# Patient Record
Sex: Male | Born: 1960 | Race: White | Hispanic: No | Marital: Married | Smoking: Never smoker
Health system: Southern US, Academic
[De-identification: ages and names within clinical notes are randomized; demographics above are authoritative.]

## PROBLEM LIST (undated history)

## (undated) DIAGNOSIS — I639 Cerebral infarction, unspecified: Secondary | ICD-10-CM

## (undated) DIAGNOSIS — I10 Essential (primary) hypertension: Secondary | ICD-10-CM

## (undated) DIAGNOSIS — E119 Type 2 diabetes mellitus without complications: Secondary | ICD-10-CM

## (undated) DIAGNOSIS — G4730 Sleep apnea, unspecified: Secondary | ICD-10-CM

## (undated) HISTORY — DX: Type 2 diabetes mellitus without complications (CMS HCC): E11.9

## (undated) HISTORY — PX: SHOULDER SURGERY: SHX246

## (undated) HISTORY — PX: HX ELBOW SURGERY: 2100001297

## (undated) HISTORY — DX: Sleep apnea, unspecified: G47.30

## (undated) HISTORY — PX: LEG SURGERY: SHX1003

## (undated) HISTORY — DX: Essential (primary) hypertension: I10

## (undated) HISTORY — DX: Cerebral infarction, unspecified (CMS HCC): I63.9

---

## 1997-04-12 ENCOUNTER — Other Ambulatory Visit (HOSPITAL_COMMUNITY): Payer: Self-pay | Admitting: INTERNAL MEDICINE

## 2020-01-08 IMAGING — CT CT ABDOMEN & PELVIS WITHOUT DYE
2 of 3 series · 17 of 46 positions shown, 19 images · non-contrast
Comparison: None.

﻿EXAM:  CT ABDOMEN & PELVIS WITHOUT DYE
INDICATION: 59-year-old with right upper quadrant pain.  No prior malignancy.
TECHNIQUE: CT was performed without IV contrast and after oral contrast and reviewed in multiple projections.  Exam was performed using one or more of the following dose reduction techniques: Automated exposure control, adjustment of the mA and/or kV according to patient size, or the use of iterative reconstruction technique. Radiation dose 9925 mGy cm.

[cor · coronal · 1.14mm/px · 3 of 163 slices shown]
[im 55/163  soft-tissue]
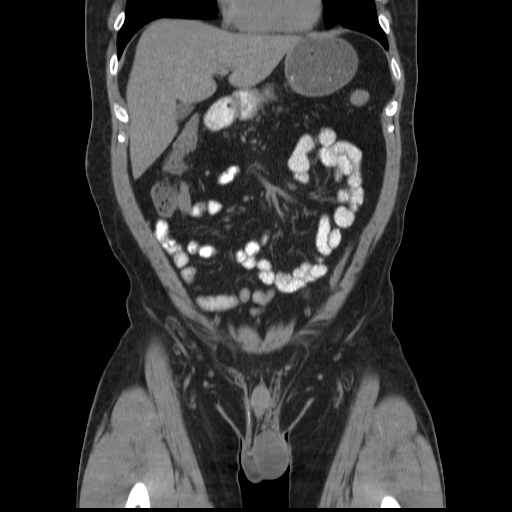
[im 73/163  soft-tissue]
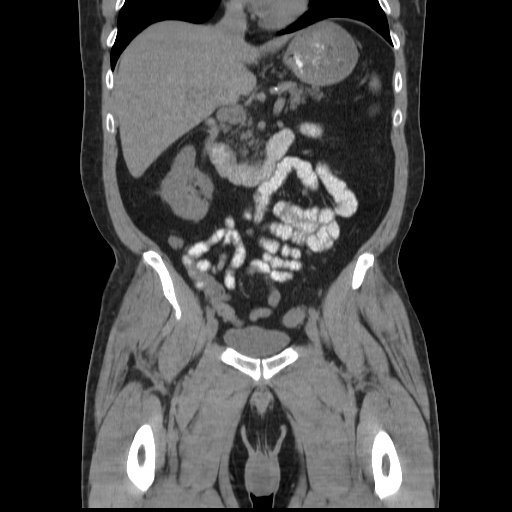
[im 91/163  soft-tissue]
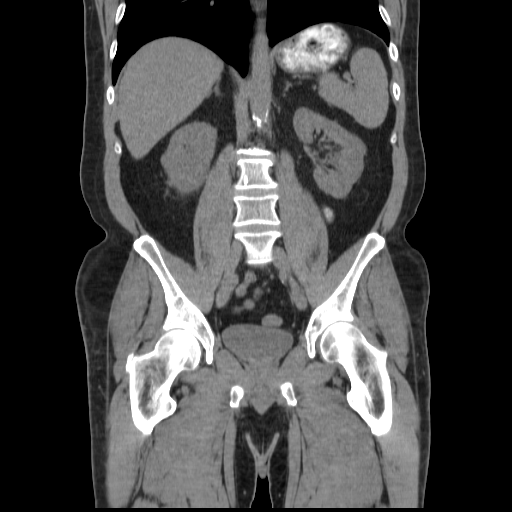

[ax · axial · 0.81mm/px · z∈[-1310,-796]mm · 14 of 297 slices shown, 16 images]
[im 20/297  soft-tissue]
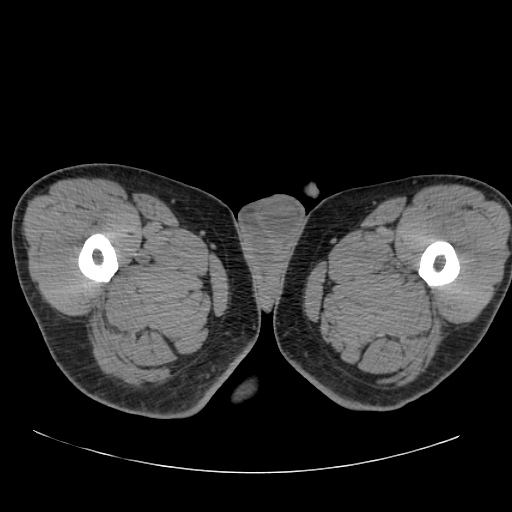
[im 20/297  bone]
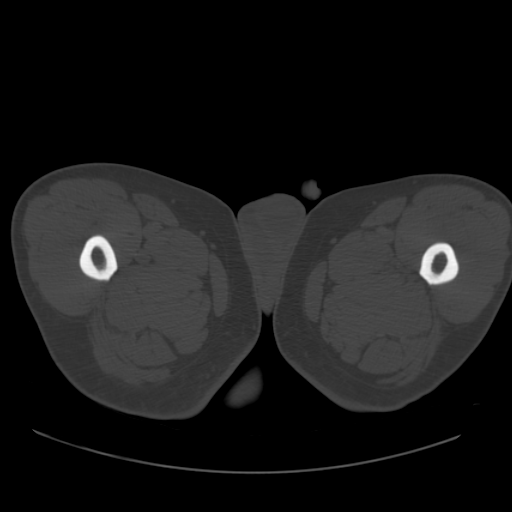
[im 39/297  soft-tissue]
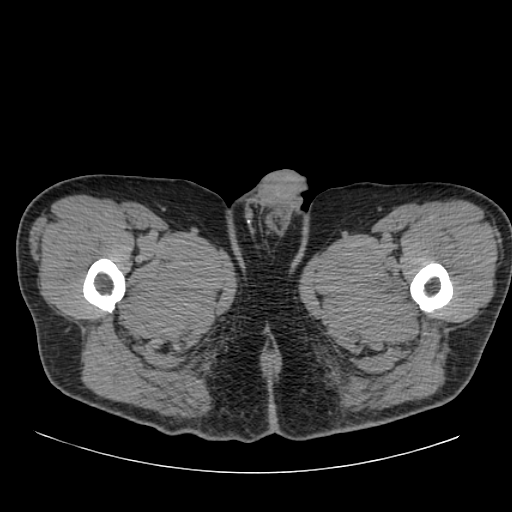
[im 58/297  soft-tissue]
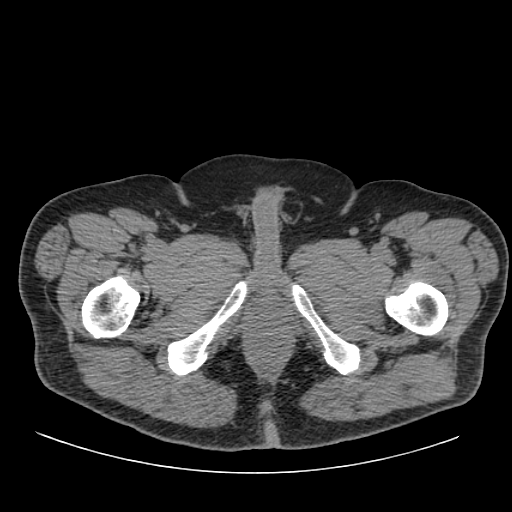
[im 77/297  soft-tissue]
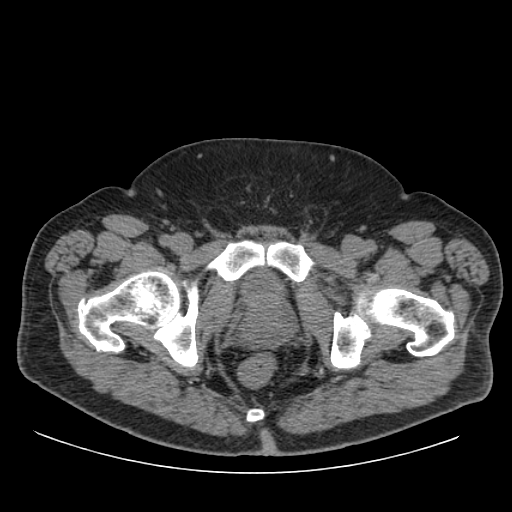
[im 96/297  soft-tissue]
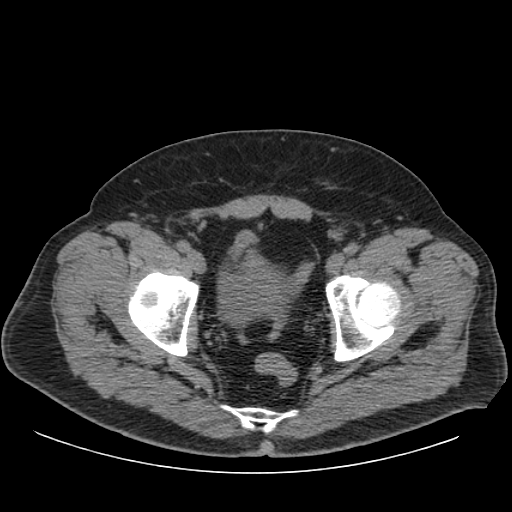
[im 115/297  soft-tissue]
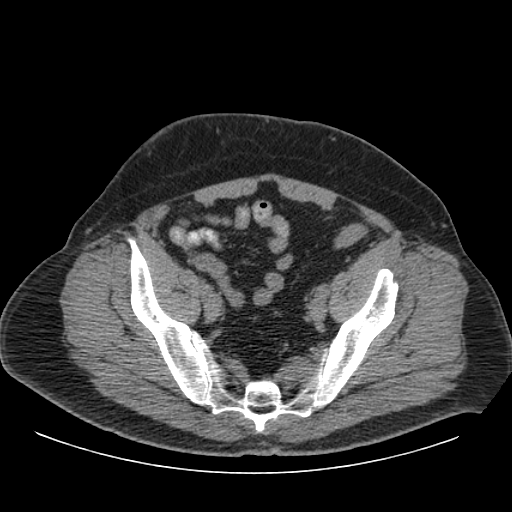
[im 134/297  soft-tissue]
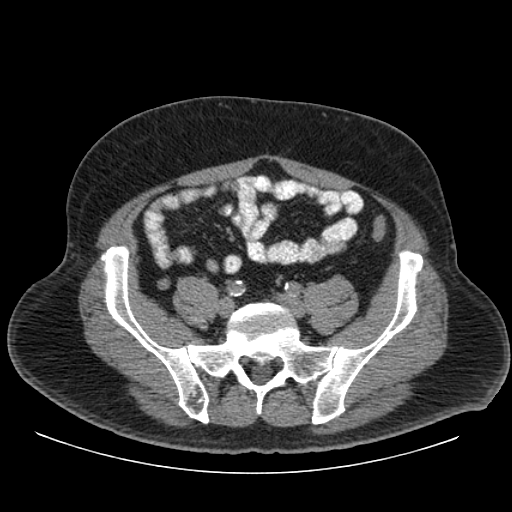
[im 163/297  soft-tissue]
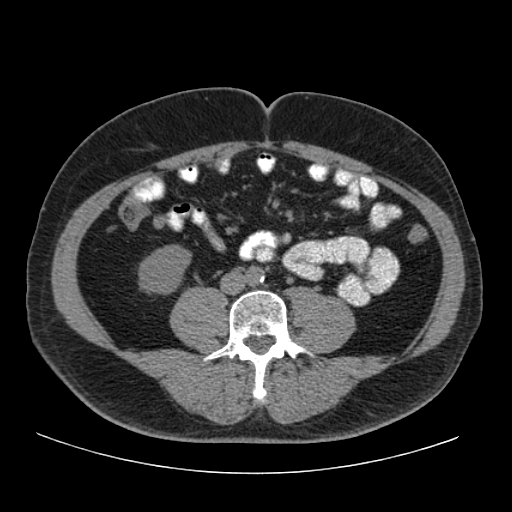
[im 182/297  soft-tissue]
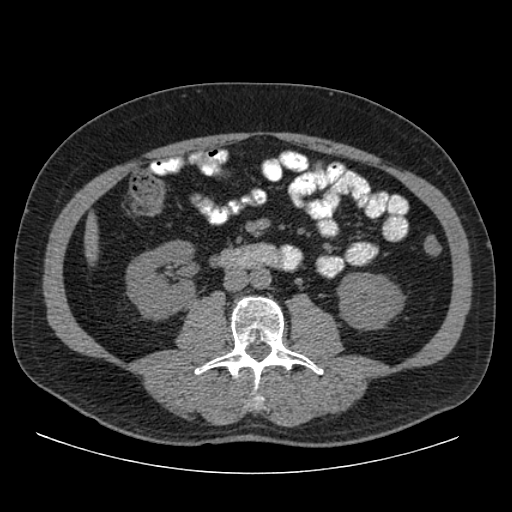
[im 182/297  bone]
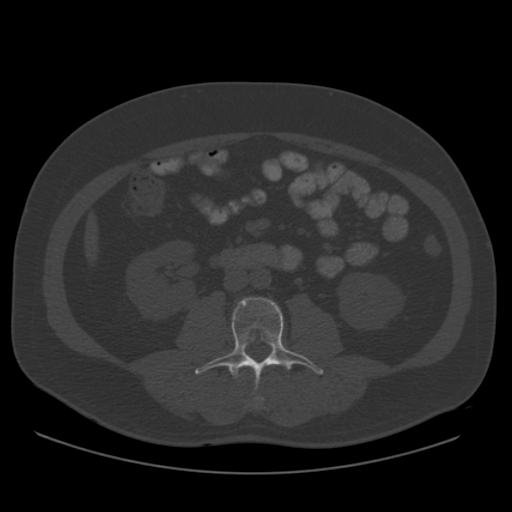
[im 201/297  soft-tissue]
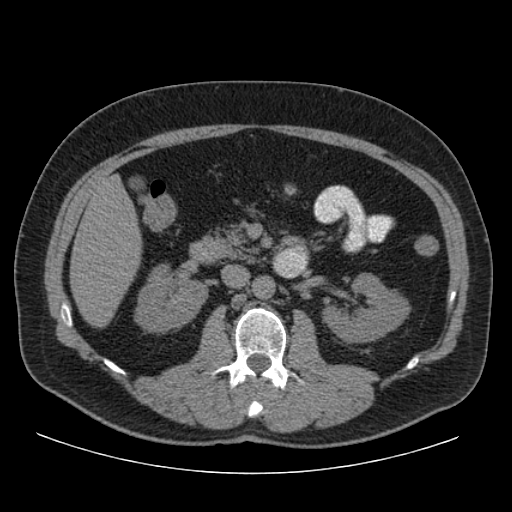
[im 220/297  soft-tissue]
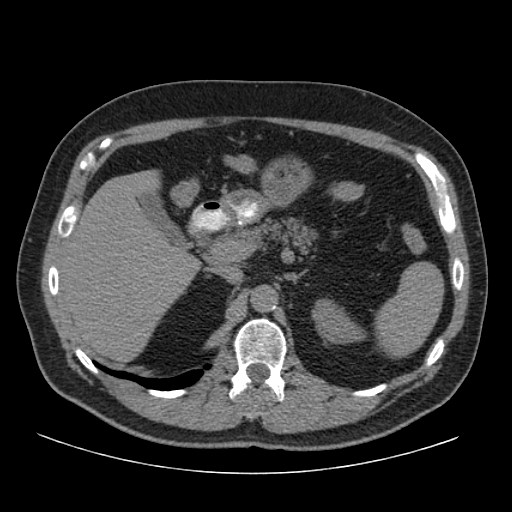
[im 239/297  soft-tissue]
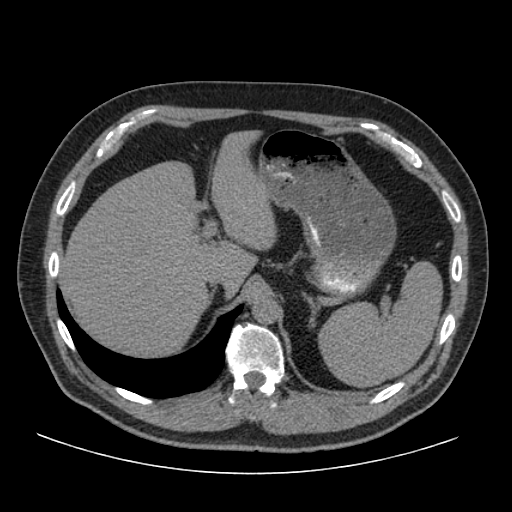
[im 258/297  soft-tissue]
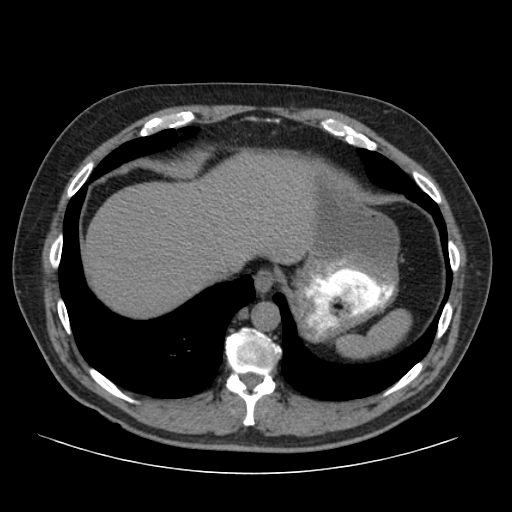
[im 277/297  soft-tissue]
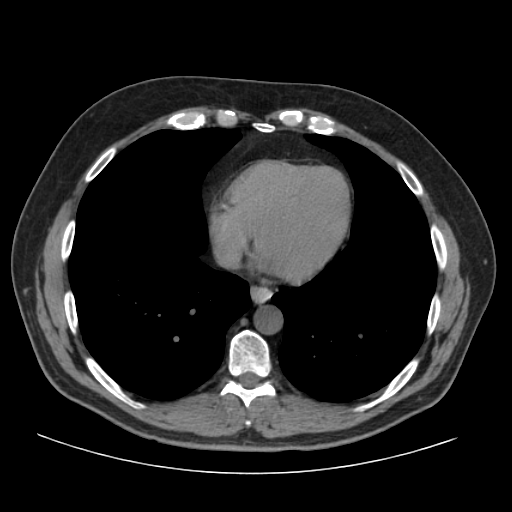

[17 of 46 positions shown; findings below may reference images not displayed]

FINDINGS: The lung bases do not show any acute or focal abnormalities.  Small hiatus hernia is noted in the lower mediastinum.  

Normal sized liver and spleen.  Gallbladder, pancreas do not show acute findings.  

No calculi or obstruction of the kidneys is seen.  Mild lobulation of the cortical margin of the right kidney is noted.  No retroperitoneal adenopathy or bowel obstruction is seen.  The appendix is normal in size.  No pelvic mass or fluid collections are seen.  Moderately enlarged prostate gland with asymmetry of peripheral zone is noted particularly on the right side measuring 5 cm in transverse diameter.
IMPRESSION: 1. Limited noncontrast examination is not optimal to evaluate solid viscera, neoplasms and vascular structures and GI tract.  

2. No acute findings are seen in the upper abdomen and pelvis.  

3. Other findings including small hiatus hernia and moderately enlarged prostate gland are noted.

## 2021-03-06 ENCOUNTER — Ambulatory Visit (INDEPENDENT_AMBULATORY_CARE_PROVIDER_SITE_OTHER): Payer: Self-pay | Admitting: NURSE PRACTITIONER

## 2021-08-19 ENCOUNTER — Ambulatory Visit (INDEPENDENT_AMBULATORY_CARE_PROVIDER_SITE_OTHER): Payer: Self-pay | Admitting: NURSE PRACTITIONER

## 2021-10-14 ENCOUNTER — Ambulatory Visit (INDEPENDENT_AMBULATORY_CARE_PROVIDER_SITE_OTHER): Payer: Commercial Managed Care - PPO | Admitting: NURSE PRACTITIONER

## 2021-10-30 ENCOUNTER — Other Ambulatory Visit: Payer: Self-pay

## 2021-10-30 ENCOUNTER — Encounter (INDEPENDENT_AMBULATORY_CARE_PROVIDER_SITE_OTHER): Payer: Self-pay | Admitting: NURSE PRACTITIONER

## 2021-10-30 ENCOUNTER — Ambulatory Visit (INDEPENDENT_AMBULATORY_CARE_PROVIDER_SITE_OTHER): Payer: Commercial Managed Care - PPO | Admitting: NURSE PRACTITIONER

## 2021-10-30 VITALS — Ht 69.0 in | Wt 199.0 lb

## 2021-10-30 DIAGNOSIS — R490 Dysphonia: Secondary | ICD-10-CM

## 2021-10-30 DIAGNOSIS — K219 Gastro-esophageal reflux disease without esophagitis: Secondary | ICD-10-CM

## 2021-10-30 DIAGNOSIS — R0982 Postnasal drip: Secondary | ICD-10-CM

## 2021-10-30 MED ORDER — FAMOTIDINE 40 MG TABLET
40.0000 mg | ORAL_TABLET | Freq: Every evening | ORAL | 3 refills | Status: DC
Start: 2021-10-30 — End: 2022-03-27

## 2021-10-30 NOTE — H&P (Signed)
[facility], [facility]  [address]  [place] [address]  Phone: [phone]  Fax: [phone]      Encounter Date: 10/30/2021    Patient ID: Brendan Guerrero  MRN: [id number]    DOB: 08-27-1960  Age: 61 y.o. male         Referring Provider:    Jerold Coombe, FNP  [address]  [place],  [place] [address]    Reason for Visit:   Chief Complaint   Patient presents with    Throat Symptoms     Pt complains of sore throat, PND, cough and hoarseness x 4 years        History of Present Illness:  Brendan Guerrero is a 61 y.o. male complains of throat discomfort and PND since 2018.  Started out with a coating in his mouth.  He was seen by dentist and suggested he has Hepatitis but no evaluation for this was ever done.  Will have hoarseness, a foul taste, and increased pnd in am.        Patient History:  There is no problem list on file for this patient.    Current Outpatient Medications   Medication Sig    atorvastatin (LIPITOR) 80 mg Oral Tablet     buprenorphine 10 mcg/hour Transdermal Patch Weekly     famotidine (PEPCID) 40 mg Oral Tablet Take 1 Tablet (40 mg total) by mouth Every evening    FARXIGA 10 mg Oral Tablet     fenofibrate nanocrystallized (TRICOR) 145 mg Oral Tablet     FLUoxetine (PROZAC) 20 mg Oral Capsule     gabapentin (NEURONTIN) 800 mg Oral Tablet     hydrOXYzine HCL (ATARAX) 25 mg Oral Tablet     lisinopriL (PRINIVIL) 10 mg Oral Tablet Take 1 Tablet (10 mg total) by mouth    MetFORMIN (GLUCOPHAGE) 1,000 mg Oral Tablet     MOUNJARO 5 mg/0.5 mL Subcutaneous Pen Injector     oxyCODONE-acetaminophen (PERCOCET) 10-325 mg Oral tablet      No Known Allergies  Past Medical History:   Diagnosis Date    CVA (cerebrovascular accident) (CMS HCC)     Diabetes mellitus (CMS HCC)     Essential hypertension     Sleep apnea       Past Surgical History:   Procedure Laterality Date    HX ELBOW SURGERY Bilateral     LEG SURGERY      SHOULDER SURGERY        Family Medical History:    None         Social History     Tobacco  Use    Smoking status: Never    Smokeless tobacco: Never   Substance Use Topics    Alcohol use: Never    Drug use: Never       Review of Systems     Vitals:    10/30/21 1403   Weight: 90.3 kg (199 lb)   Height: 1.753 m (5\' 9" )   BMI: 29.45      ENT Physical Exam  Constitutional  Appearance: patient appears well-developed, well-nourished and well-groomed,  Communication/Voice: communication appropriate for developmental age; vocal quality normal;  Head and Face  Appearance: head appears normal, face appears normal and face appears atraumatic;  Palpation: facial palpation normal;  Salivary: glands normal;  Ear  Hearing: intact;  Auricles: right auricle normal; left auricle normal;  External Mastoids: right external mastoid normal; left external mastoid normal;  Ear Canals: right ear canal  normal; left ear canal normal;  Tympanic Membranes: right tympanic membrane normal; left tympanic membrane normal;  Nose  External Nose: nares patent bilaterally; external nose normal;  Internal Nose: nasal mucosa normal; septum normal; bilateral inferior turbinates normal;  Oral Cavity/Oropharynx  Lips: normal;  Teeth: normal;  Gums: gingiva normal;  Tongue: normal;  Oral mucosa: normal;  Hard palate: normal;  Soft palate: normal;  Tonsils: normal;  Base of Tongue: normal;  Posterior pharyngeal wall: normal;  Neck  Neck: neck normal; neck palpation normal;  Thyroid: thyroid normal;  Respiratory  Inspection: breathing unlabored; normal breathing rate;  Lymphatic  Palpation: lymph nodes normal;  Neurovestibular  Mental Status: alert and oriented;  Psychiatric: mood normal; affect is appropriate;  Cranial Nerves: cranial nerves intact;     Assessment:  ENCOUNTER DIAGNOSES     ICD-10-CM   1. Post-nasal drainage  R09.82   2. Hoarseness  R49.0   3. 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  Oral exam normal, no coating on tongue  Throat symptoms  are due to LPR.  Advised reflux diet and  famotidine 40 mg nightly.  Will repeat laryngoscopy next visit.      Orders Placed This Encounter    31575 - LARYNGOSCOPY, FLEXIBLE DIAGNOSTIC (AMB ONLY)    famotidine (PEPCID) 40 mg Oral Tablet     Return for 6-8 weeks.    Elnora Morrison, FNP-BC  10/30/2021, 14:30

## 2021-11-06 NOTE — Procedures (Signed)
[facility]  [address]  [place] [address]    Procedure Note    Name: ZAYVIER CARAVELLO MRN:  [id number]   Date: 10/30/2021 Age: 61 y.o.  DOB:   10/30/1960       00923 - LARYNGOSCOPY, FLEXIBLE DIAGNOSTIC (AMB ONLY)  Performed by: Elnora Morrison, FNP-BC  Authorized by: Elnora Morrison, FNP-BC     Time Out:     Immediately before the procedure, a time out was called:  Yes    Patient verified:  Yes    Procedure Verified:  Yes    Site Verified:  Yes  Documentation:      Indications for procedure: Unable to perform indirect laryngoscopy secondary to gag reflex, Unable to fully visualize laryngeal anatomy on indirect laryngoscopy, Hoarseness, and Chronic postnasal drip    Anesthesia: Oxymetazoline nasal spray    Description: The flexible endoscope was gently introduced into the nostril and passed along the floor of the nose to the nasopharynx. Adenoid was minimal and eustachian tubes normal. The retropalatal airway was patent.    The endoscope was passed to the oropharynx. Base of tongue displayed normal lingual tonsils, patent valelulla, and sharply defined upright epiglottis. Retrolingual airway was patent.    The larynx displayed normal true vocal cords with good mobility. False cords were normal. Arytenoid mucosa was pink with no edema.     The piriform recesses were symmetric without secretion. The hypopharynx was symmetric without lesion.    Findings: Laryngopharyngeal Reflux    The patient tolerated the procedure well.               Elnora Morrison, FNP-BC

## 2021-11-13 ENCOUNTER — Ambulatory Visit (INDEPENDENT_AMBULATORY_CARE_PROVIDER_SITE_OTHER): Payer: Commercial Managed Care - PPO | Admitting: NURSE PRACTITIONER

## 2021-12-25 ENCOUNTER — Encounter (INDEPENDENT_AMBULATORY_CARE_PROVIDER_SITE_OTHER): Payer: Self-pay | Admitting: NURSE PRACTITIONER

## 2021-12-25 ENCOUNTER — Other Ambulatory Visit: Payer: Self-pay

## 2021-12-25 ENCOUNTER — Ambulatory Visit (INDEPENDENT_AMBULATORY_CARE_PROVIDER_SITE_OTHER): Payer: Commercial Managed Care - PPO | Admitting: NURSE PRACTITIONER

## 2021-12-25 VITALS — Ht 70.0 in | Wt 193.0 lb

## 2021-12-25 DIAGNOSIS — R490 Dysphonia: Secondary | ICD-10-CM

## 2021-12-25 DIAGNOSIS — K219 Gastro-esophageal reflux disease without esophagitis: Secondary | ICD-10-CM

## 2021-12-25 DIAGNOSIS — R1312 Dysphagia, oropharyngeal phase: Secondary | ICD-10-CM

## 2021-12-25 NOTE — Procedures (Signed)
[facility]  [place] [id number]    Procedure Note    Name: RODELL GANTZ MRN:  [id number]   Date: 12/25/2021 Age: 61 y.o.  DOB:   07-30-1960       [id number] - LARYNGOSCOPY, FLEXIBLE DIAGNOSTIC (AMB ONLY)    Performed by: Emiliano Dyer, FNP-BC  Authorized by: Emiliano Dyer, FNP-BC    Time Out:     Immediately before the procedure, a time out was called:  Yes    Patient verified:  Yes    Procedure Verified:  Yes    Site Verified:  Yes  Documentation:      Indications for procedure: Unable to perform indirect laryngoscopy secondary to gag reflex, Unable to fully visualize laryngeal anatomy on indirect laryngoscopy, and Dysphagia    Anesthesia: Oxymetazoline nasal spray    Description: The flexible endoscope was gently introduced into the nostril and passed along the floor of the nose to the nasopharynx. Adenoid was minimal and eustachian tubes normal. The retropalatal airway was patent.    The endoscope was passed to the oropharynx. Base of tongue displayed normal lingual tonsils, patent valelulla, and sharply defined upright epiglottis. Retrolingual airway was patent.    The larynx displayed normal true vocal cords with good mobility. False cords were normal. Arytenoid mucosa was pink with no edema.     The piriform recesses were symmetric without secretion. The hypopharynx was symmetric without lesion.    Findings: Laryngopharyngeal Reflux improved    The patient tolerated the procedure well.                 Emiliano Dyer, FNP-BC

## 2021-12-25 NOTE — Progress Notes (Signed)
[facility]  [address]  [place] [address]  Phone: [phone]  Fax: [phone]      Encounter Date: 12/25/2021    Patient ID: Brendan Guerrero  MRN: [id number]    DOB: Jun 24, 1960  Age: 61 y.o. male     Progress Note       Referring Provider:  No ref. provider found    Reason for Visit:   Chief Complaint   Patient presents with    Acid Reflux     8 week rc, patient complains of PND and globus sensation        History of Present Illness:  Brendan Guerrero is a 61 y.o. male follow up hoarseness and LPR.  He continues to have a globus sensation and some dysphagia with solids and liquids.        Patient History:  There is no problem list on file for this patient.    Current Outpatient Medications   Medication Sig    atorvastatin (LIPITOR) 80 mg Oral Tablet     famotidine (PEPCID) 40 mg Oral Tablet Take 1 Tablet (40 mg total) by mouth Every evening    FARXIGA 10 mg Oral Tablet     fenofibrate nanocrystallized (TRICOR) 145 mg Oral Tablet     FLUoxetine (PROZAC) 40 mg Oral Capsule     hydrOXYzine HCL (ATARAX) 25 mg Oral Tablet     ketorolac tromethamine (TORADOL) 10 mg Oral Tablet     lisinopriL (PRINIVIL) 10 mg Oral Tablet Take 1 Tablet (10 mg total) by mouth    MetFORMIN (GLUCOPHAGE) 1,000 mg Oral Tablet     MOUNJARO 5 mg/0.5 mL Subcutaneous Pen Injector       No Known Allergies  Past Medical History:   Diagnosis Date    CVA (cerebrovascular accident) (CMS HCC)     Diabetes mellitus (CMS HCC)     Essential hypertension     Sleep apnea      Past Surgical History:   Procedure Laterality Date    HX ELBOW SURGERY Bilateral     LEG SURGERY      SHOULDER SURGERY       Family Medical History:    None         Social History     Tobacco Use    Smoking status: Never    Smokeless tobacco: Never   Substance Use Topics    Alcohol use: Never    Drug use: Never       Review of Systems     Vitals:    12/25/21 0956   Weight: 87.5 kg (193 lb)   Height: 1.778 m (5\' 10" )   BMI: 27.75      ENT Physical  Exam  Constitutional  Appearance: patient appears well-developed, well-nourished and well-groomed,  Communication/Voice: communication appropriate for developmental age; vocal quality normal;  Head and Face  Appearance: head appears normal, face appears normal and face appears atraumatic;  Palpation: facial palpation normal;  Salivary: glands normal;  Ear  Hearing: intact;  Auricles: right auricle normal; left auricle normal;  External Mastoids: right external mastoid normal; left external mastoid normal;  Ear Canals: right ear canal normal; left ear canal normal;  Tympanic Membranes: right tympanic membrane normal; left tympanic membrane normal;  Nose  External Nose: nares patent bilaterally; external nose normal;  Internal Nose: nasal mucosa normal; septum normal; bilateral inferior turbinates normal;  Oral Cavity/Oropharynx  Lips: normal;  Teeth: normal;  Gums: gingiva normal;  Tongue: normal;  Oral mucosa: normal;  Hard palate: normal;  Soft palate: normal;  Tonsils: normal;  Base of Tongue: normal;  Posterior pharyngeal wall: normal;  Neck  Neck: neck normal; neck palpation normal;  Thyroid: thyroid normal;  Respiratory  Inspection: breathing unlabored; normal breathing rate;  Lymphatic  Palpation: lymph nodes normal;  Neurovestibular  Mental Status: alert and oriented;  Psychiatric: mood normal; affect is appropriate;  Cranial Nerves: cranial nerves intact;       Assessment:  ENCOUNTER DIAGNOSES     ICD-10-CM   1. Oropharyngeal dysphagia  R13.12   2. 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 Hoarseness  R49.0       Plan:  Medical records reviewed on 12/25/2021.  LPR findings are improving.  Will continue famotidine daily.  Unsure why dysphagia is present with LPR changing improving.  Will obtain MBSS  Orders Placed This Encounter    31575 - LARYNGOSCOPY, FLEXIBLE DIAGNOSTIC (AMB ONLY)    FLUORO ESOPHAGRAM, MODIFIED SWALLOW     Return in about 3 months (around 03/27/2022).    Emiliano Dyer, FNP-BC   12/25/2021, 10:37

## 2021-12-26 ENCOUNTER — Telehealth (INDEPENDENT_AMBULATORY_CARE_PROVIDER_SITE_OTHER): Payer: Self-pay | Admitting: NURSE PRACTITIONER

## 2022-01-20 ENCOUNTER — Ambulatory Visit (HOSPITAL_COMMUNITY): Payer: Self-pay

## 2022-01-21 ENCOUNTER — Telehealth (INDEPENDENT_AMBULATORY_CARE_PROVIDER_SITE_OTHER): Payer: Self-pay | Admitting: NURSE PRACTITIONER

## 2022-02-03 ENCOUNTER — Encounter (INDEPENDENT_AMBULATORY_CARE_PROVIDER_SITE_OTHER): Payer: Self-pay | Admitting: NURSE PRACTITIONER

## 2022-02-26 ENCOUNTER — Ambulatory Visit (HOSPITAL_COMMUNITY): Payer: Self-pay

## 2022-03-06 ENCOUNTER — Telehealth (INDEPENDENT_AMBULATORY_CARE_PROVIDER_SITE_OTHER): Payer: Self-pay | Admitting: OTOLARYNGOLOGY

## 2022-03-09 ENCOUNTER — Telehealth (INDEPENDENT_AMBULATORY_CARE_PROVIDER_SITE_OTHER): Payer: Self-pay | Admitting: NURSE PRACTITIONER

## 2022-03-12 ENCOUNTER — Encounter (INDEPENDENT_AMBULATORY_CARE_PROVIDER_SITE_OTHER): Payer: Self-pay | Admitting: NURSE PRACTITIONER

## 2022-03-27 ENCOUNTER — Other Ambulatory Visit (INDEPENDENT_AMBULATORY_CARE_PROVIDER_SITE_OTHER): Payer: Self-pay | Admitting: NURSE PRACTITIONER

## 2022-04-20 ENCOUNTER — Other Ambulatory Visit (INDEPENDENT_AMBULATORY_CARE_PROVIDER_SITE_OTHER): Payer: Self-pay | Admitting: NURSE PRACTITIONER

## 2022-04-22 ENCOUNTER — Ambulatory Visit (HOSPITAL_COMMUNITY): Payer: Self-pay

## 2022-05-06 ENCOUNTER — Encounter (INDEPENDENT_AMBULATORY_CARE_PROVIDER_SITE_OTHER): Payer: Self-pay | Admitting: NURSE PRACTITIONER

## 2022-05-11 IMAGING — MR MRI LUMBAR SPINE WITHOUT CONTRAST
5 of 6 series · 33 of 48 positions shown · non-contrast
Comparison: None available.

﻿EXAM:  [id number]   MRI LUMBAR SPINE WITHOUT CONTRAST
INDICATION: Low back pain radiating down left leg.
TECHNIQUE: Noncontrast multiplanar, multisequence MRI was performed.

[Series 5: T2 · sagittal · 4.0mm · 0.94mm/px · 6 of 15 slices shown (1 of 3)]
[im 1/15]
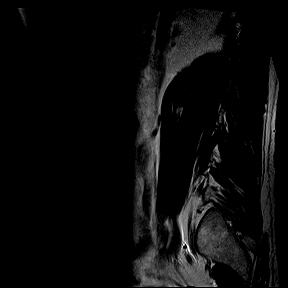
[im 3/15]
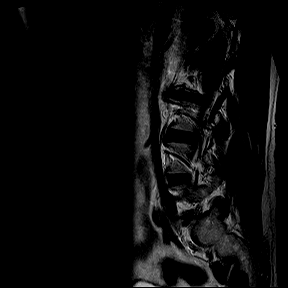
[im 6/15]
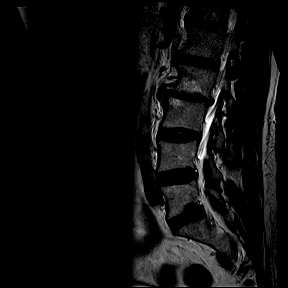
[im 9/15]
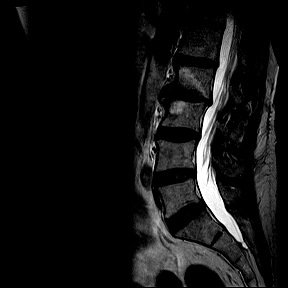
[im 12/15]
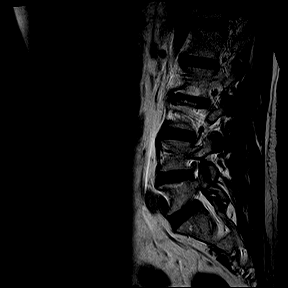
[im 15/15]
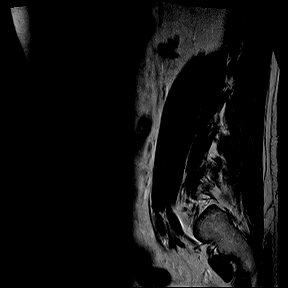

[Series 6: T1 · sagittal · 4.0mm · 0.94mm/px · 7 of 15 slices shown (1 of 2)]
[im 1/15]
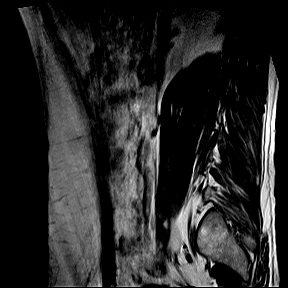
[im 3/15]
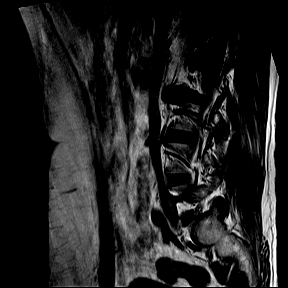
[im 5/15]
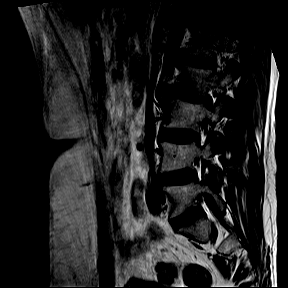
[im 8/15]
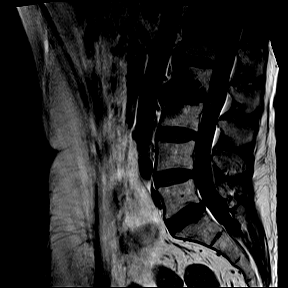
[im 10/15]
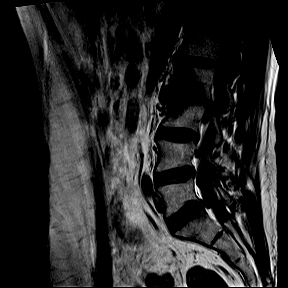
[im 12/15]
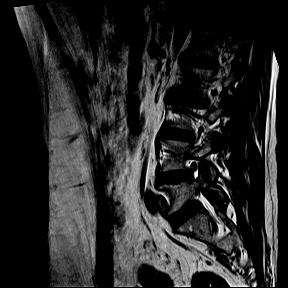
[im 15/15]
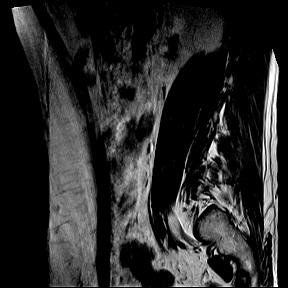

[Series 8: T2 · axial · 4.0mm · 0.52mm/px · z∈[-72,+134]mm · 8 of 23 slices shown (2 of 3)]
[im 1/23]
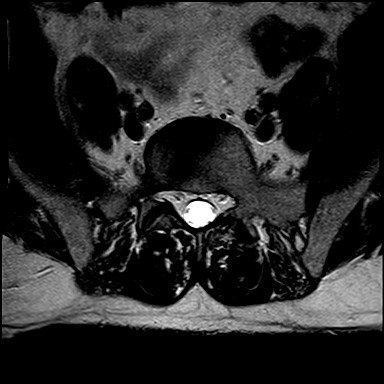
[im 3/23]
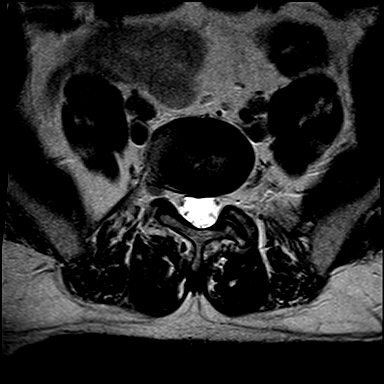
[im 8/23]
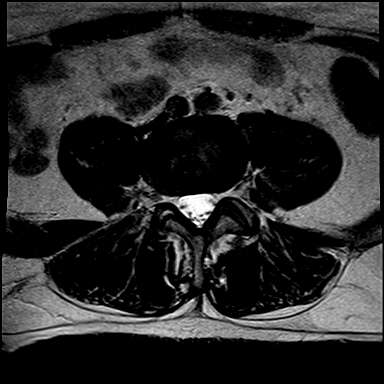
[im 10/23]
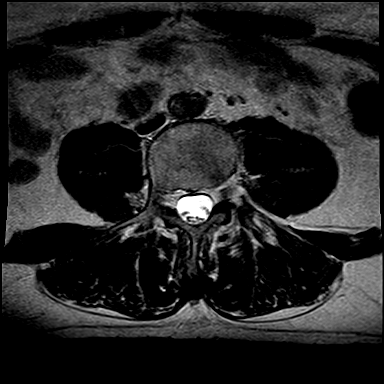
[im 13/23]
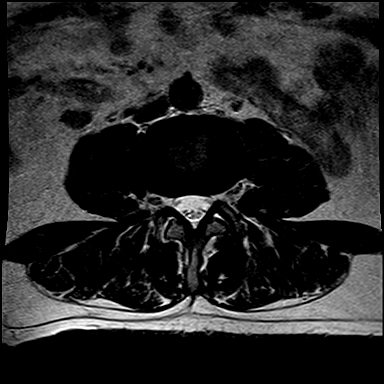
[im 15/23]
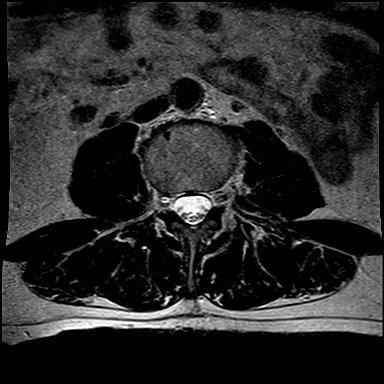
[im 20/23]
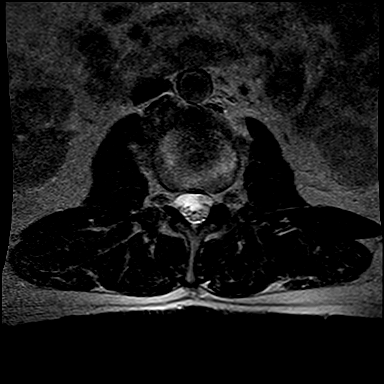
[im 23/23]
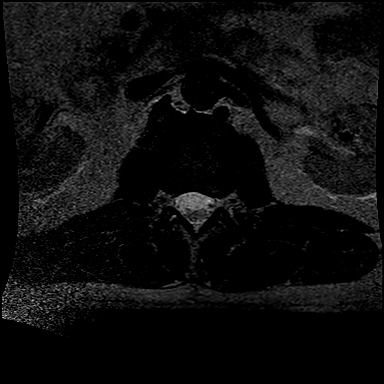

[Series 9: T1 · axial · 4.0mm · 0.52mm/px · z∈[-72,+5]mm · 4 of 23 slices shown (2 of 2)]
[im 1/23]
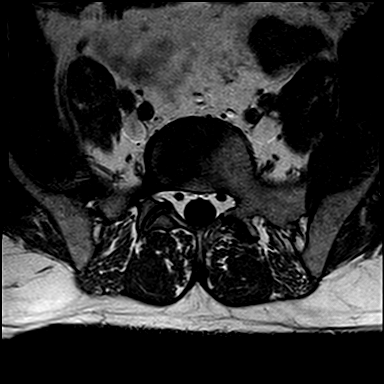
[im 3/23]
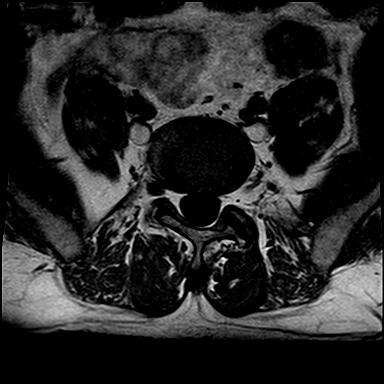
[im 8/23]
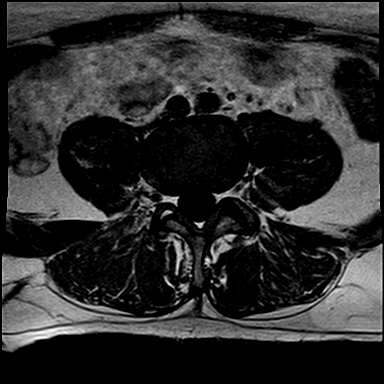
[im 10/23]
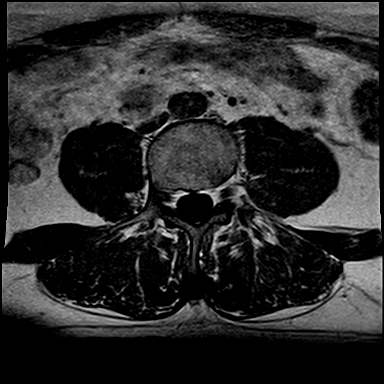

[Series 10: T2 · coronal · 5.0mm · 0.82mm/px · 8 of 18 slices shown (3 of 3)]
[im 1/18]
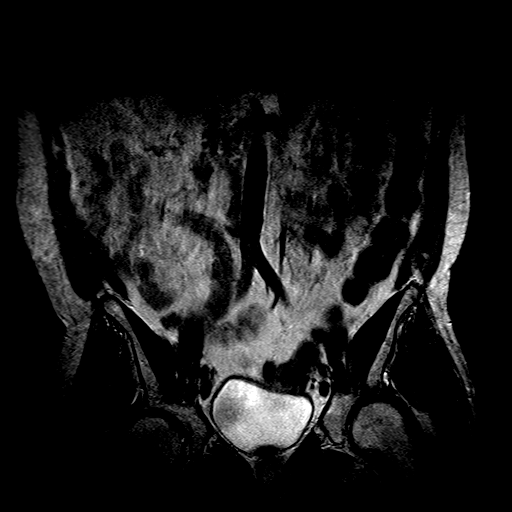
[im 3/18]
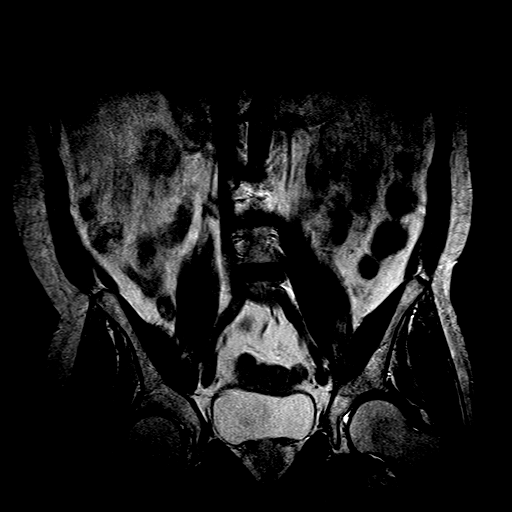
[im 5/18]
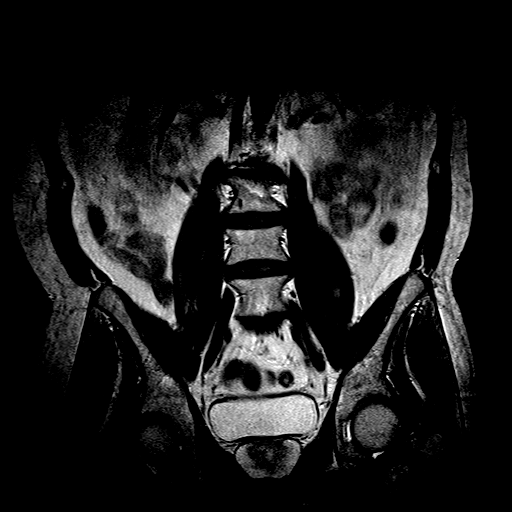
[im 8/18]
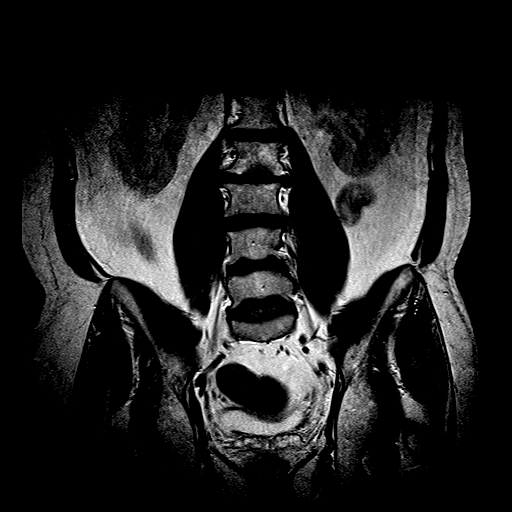
[im 10/18]
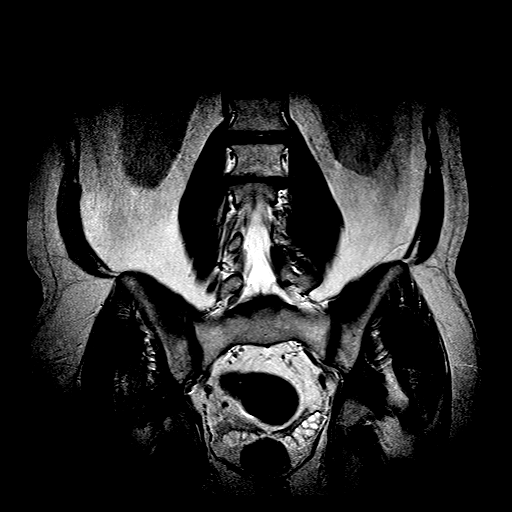
[im 13/18]
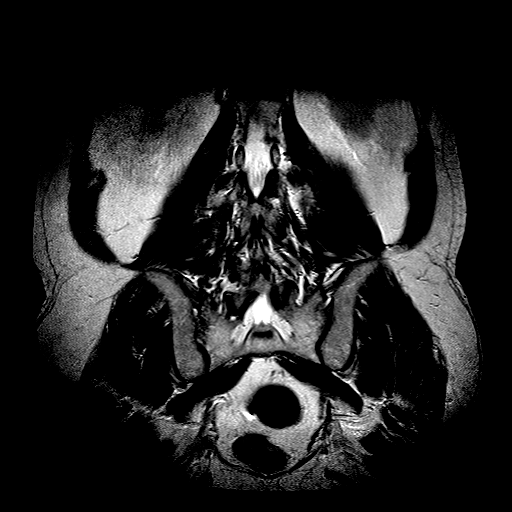
[im 15/18]
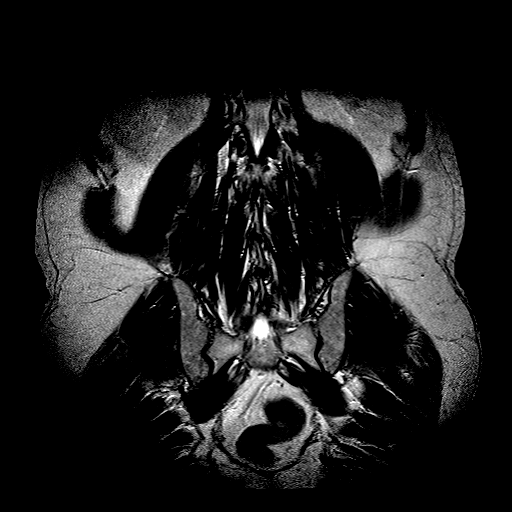
[im 18/18]
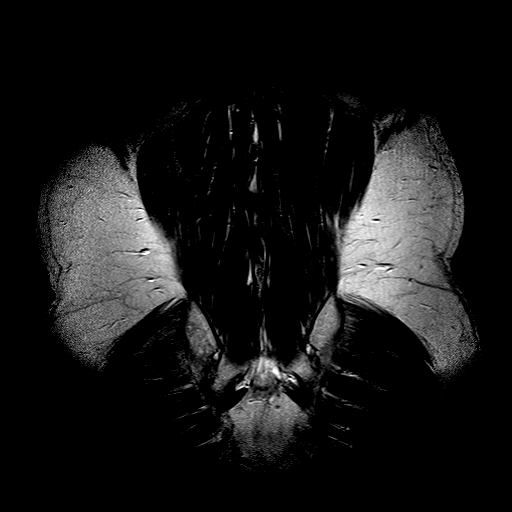

[33 of 48 positions shown; findings below may reference images not displayed]

FINDINGS: There are moderately severe degenerative changes at L2-L3 with disc desiccation, disc space narrowing, osteophyte formation, or fairly extensive associated degenerative marrow signal alteration.  There is also mild disc bulging at L2-L3.  

Mild degenerative changes are noted at multiple levels elsewhere.  There is no other significant disc protrusion.  No frank disc herniation is seen.  

There is no fracture, malalignment, spinal stenosis, or abnormality of the conus.
IMPRESSION: 1. Moderately severe L2-L3 degenerative disc disease. 

2. Mild L2-L3 disc bulging.

3. No disc herniation, fracture, or spinal stenosis.

## 2022-05-19 ENCOUNTER — Other Ambulatory Visit (INDEPENDENT_AMBULATORY_CARE_PROVIDER_SITE_OTHER): Payer: Self-pay | Admitting: NURSE PRACTITIONER

## 2022-07-02 ENCOUNTER — Encounter (INDEPENDENT_AMBULATORY_CARE_PROVIDER_SITE_OTHER): Payer: Self-pay | Admitting: NURSE PRACTITIONER

## 2023-01-20 IMAGING — MR MRI LUMBAR SPINE WITHOUT CONTRAST
5 of 6 series · 32 of 48 positions shown · IV contrast (gadolinium)
Comparison: MRI lumbar spine 05/11/2022.

﻿EXAM:  [id number]   MRI LUMBAR SPINE WITHOUT CONTRAST
INDICATION: Persistent low back pain with radicular symptoms.  Past history of trauma. No prior history of malignancy or back surgery.
TECHNIQUE: Multiplanar, multisequential MRI of the lumbosacral spine was performed without gadolinium contrast.

[Series 8: T2 · sagittal · 5.0mm · 0.94mm/px · 6 of 13 slices shown (1 of 3)]
[im 1/13]
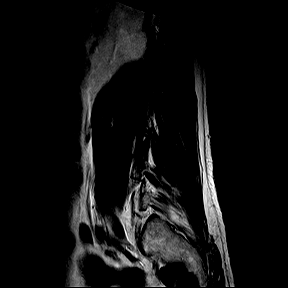
[im 3/13]
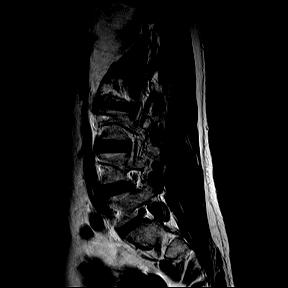
[im 5/13]
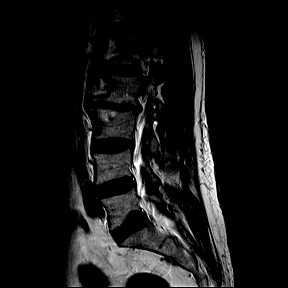
[im 8/13]
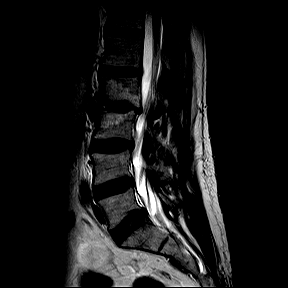
[im 10/13]
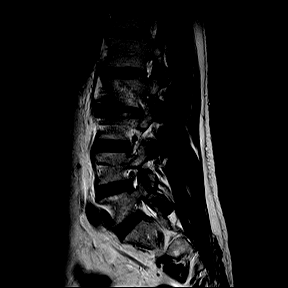
[im 13/13]
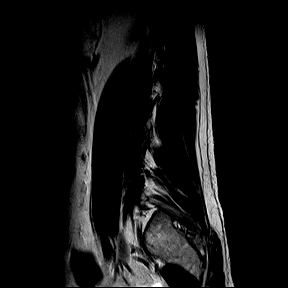

[Series 9: T1 · sagittal · 5.0mm · 0.94mm/px · 6 of 13 slices shown (1 of 2)]
[im 1/13]
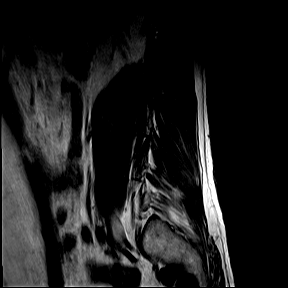
[im 3/13]
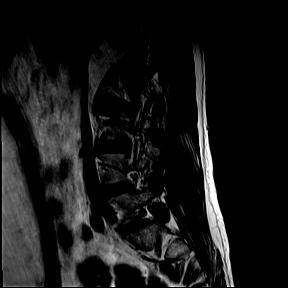
[im 5/13]
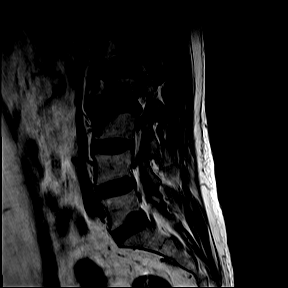
[im 8/13]
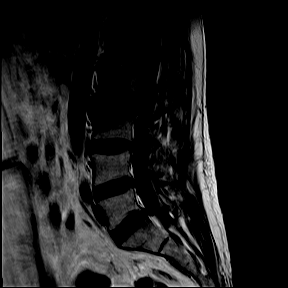
[im 10/13]
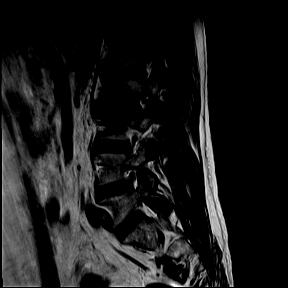
[im 13/13]
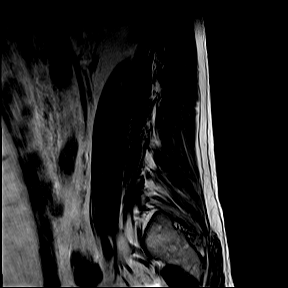

[Series 17: T2 · coronal · 5.5mm · 0.82mm/px · 8 of 18 slices shown (2 of 3)]
[im 1/18]
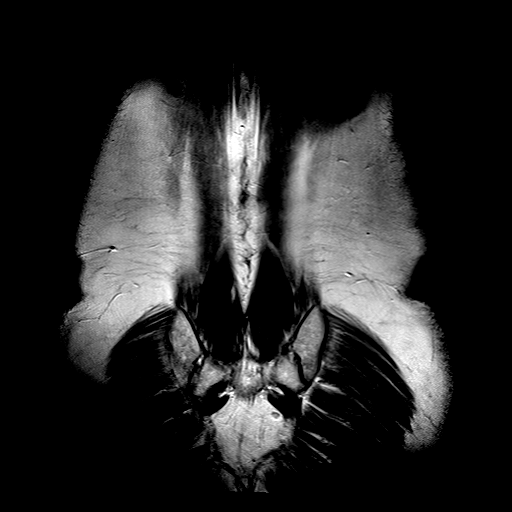
[im 3/18]
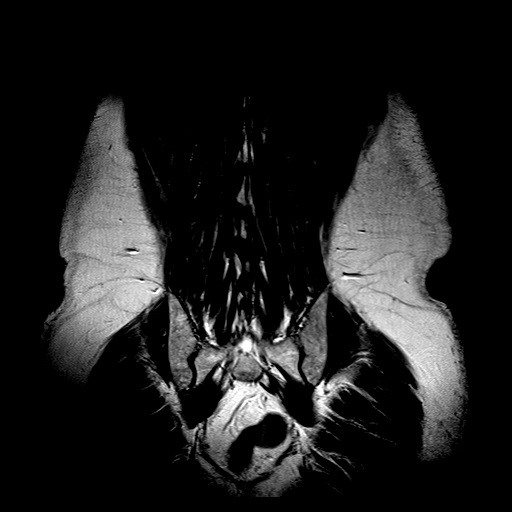
[im 5/18]
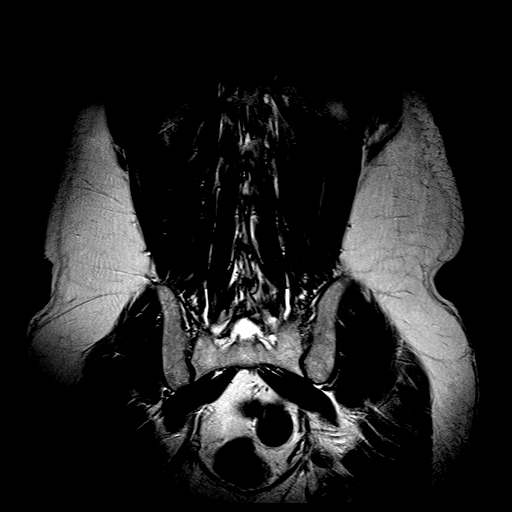
[im 8/18]
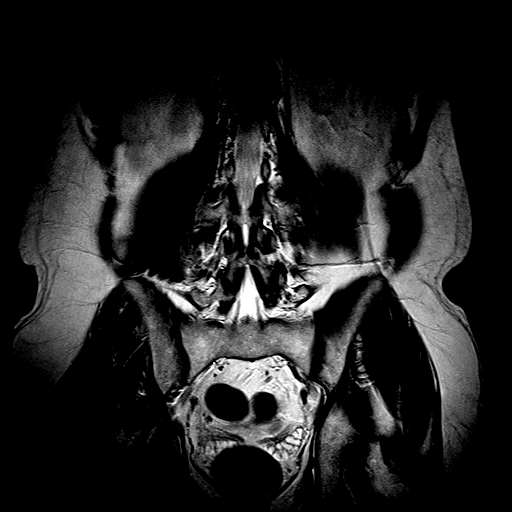
[im 10/18]
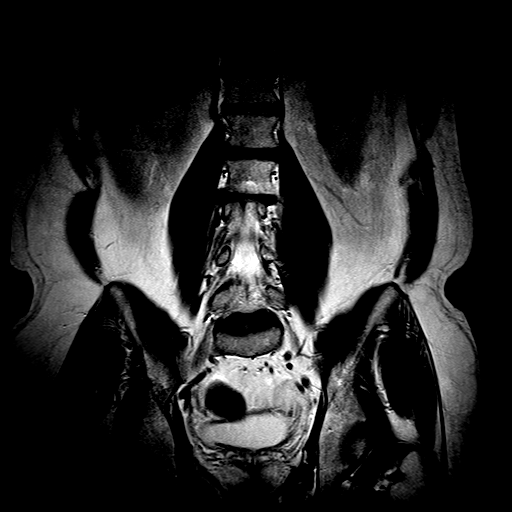
[im 13/18]
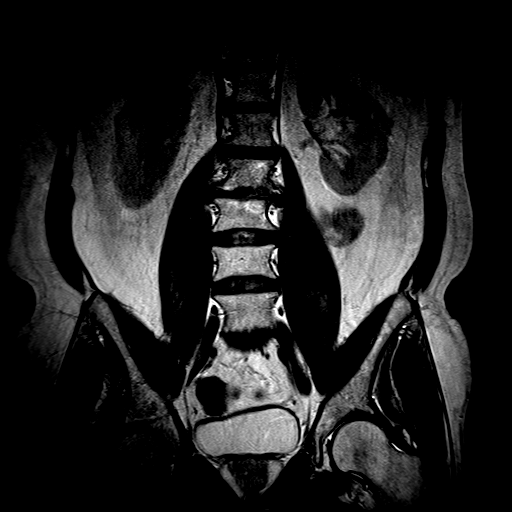
[im 15/18]
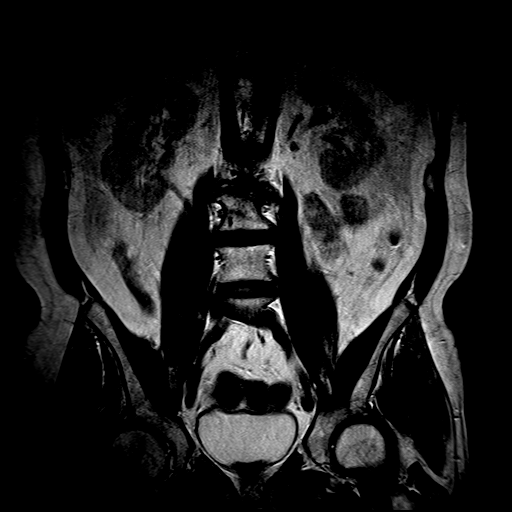
[im 18/18]
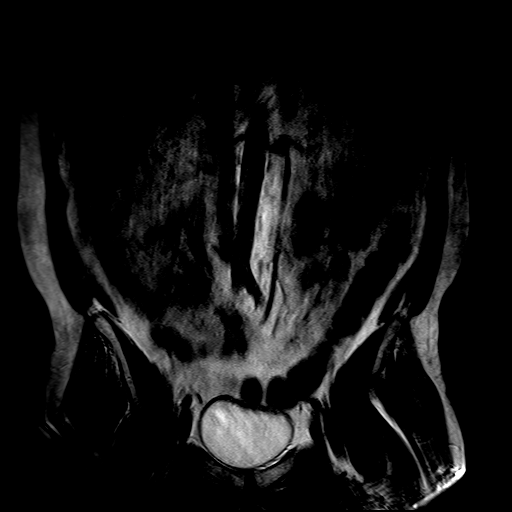

[Series 18: T2 · axial · 4.0mm · 0.52mm/px · z∈[-22,+222]mm · 11 of 26 slices shown (3 of 3)]
[im 1/26]
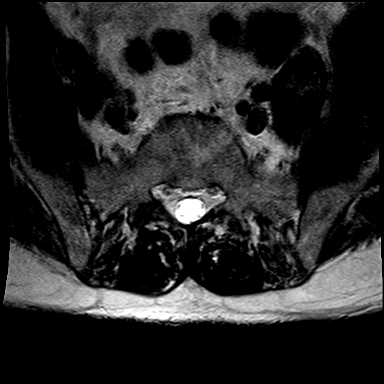
[im 3/26]
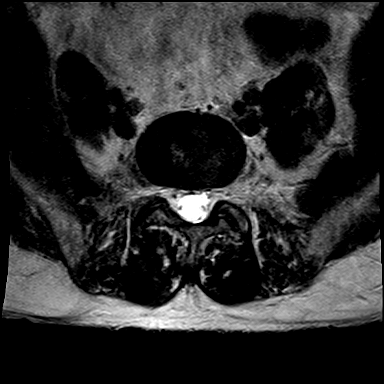
[im 6/26]
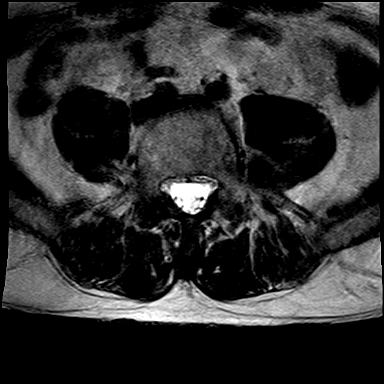
[im 8/26]
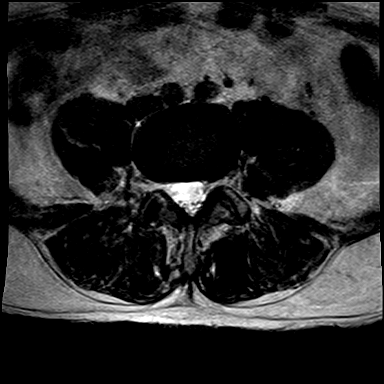
[im 11/26]
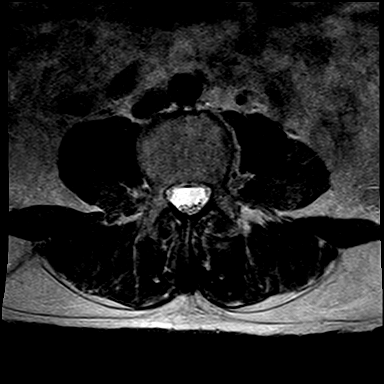
[im 13/26]
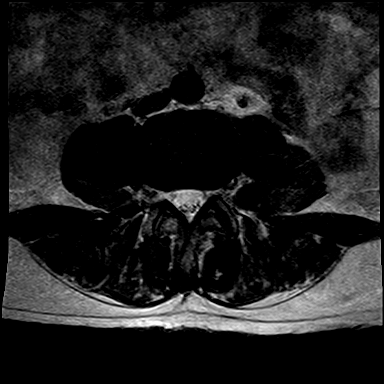
[im 16/26]
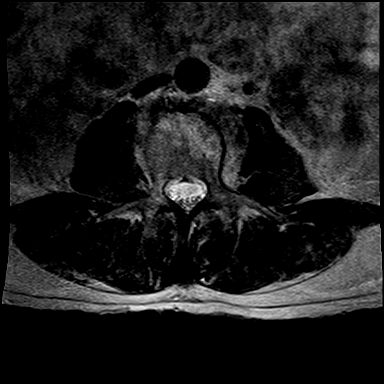
[im 18/26]
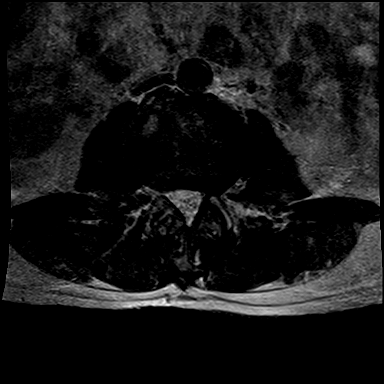
[im 21/26]
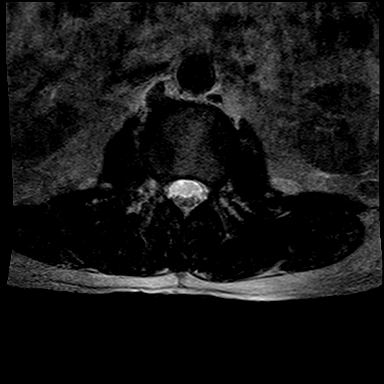
[im 23/26]
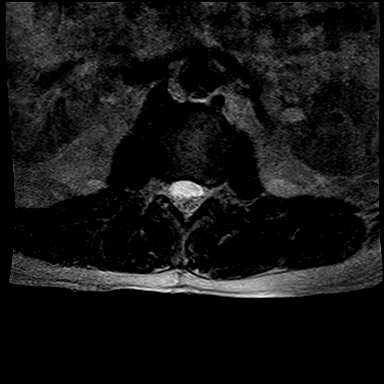
[im 26/26]
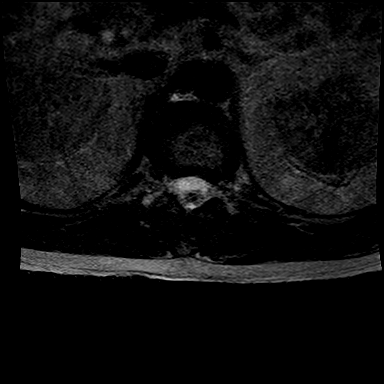

[Series 19: T1 · axial · 4.0mm · 0.52mm/px · 1 of 26 slices shown (2 of 2)]
[im 1/26]
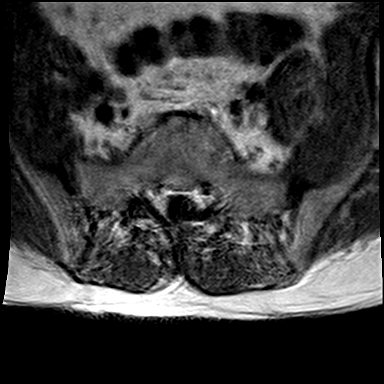

[32 of 48 positions shown; findings below may reference images not displayed]

FINDINGS: Significant Modic type 1 inflammatory changes on both sides of L2-3 disc are noted, similar to previous study. 

At L1-2 level, no focal disc lesions are seen. Degenerative disc disease and facet arthropathy are noted causing moderate compromise of both lateral recess similar to previous examination.

At L2-3 level, severe degenerative disc disease is noted. Bulging annulus is noted with facet arthropathy causing moderate compromise of both lateral recess and neural foramina and mild compromise of thecal sac in the midline.  Mild retrolisthesis of L2 on L3 is noted due to degenerative changes.

At L3-4 level, degenerative disc changes and facet arthropathy causing mild compromise of both lateral recess.  Similar finding at L4-5 level is noted.

At L5-S1 level, degenerative disc changes are noted without significant compromise of thecal sac or neural foramina.

Paravertebral soft tissues are unremarkable.
IMPRESSION: 1. Significant, Modic type 1 inflammatory changes on both sides of L2-3 disc are noted similar to the findings on 05/11/2022.

2. Multilevel degenerative disc changes and facet arthropathy as described above in detail.  Findings are unchanged in appearance from 05/11/2022.

3. Overall quality is less than optimal due to motion artifacts. Some of the series were repeated due to motion.  

Electronically Signed by KATHERINE, FONG at 27-Tov-LFL4 [DATE]

## 2023-01-20 IMAGING — DX XRAY LUMBAR SPINE [DATE] VIEWS
1 series · 3 of 3 positions shown · non-contrast
Comparison: MRI dated 01/20/2023.

﻿EXAM:  [id number]      XRAY LUMBAR SPINE [DATE] VIEWS
INDICATION: Low back pain.

[Series 1: AP · 0.14mm/px · 3 of 3 slices shown]
[im 1/3]
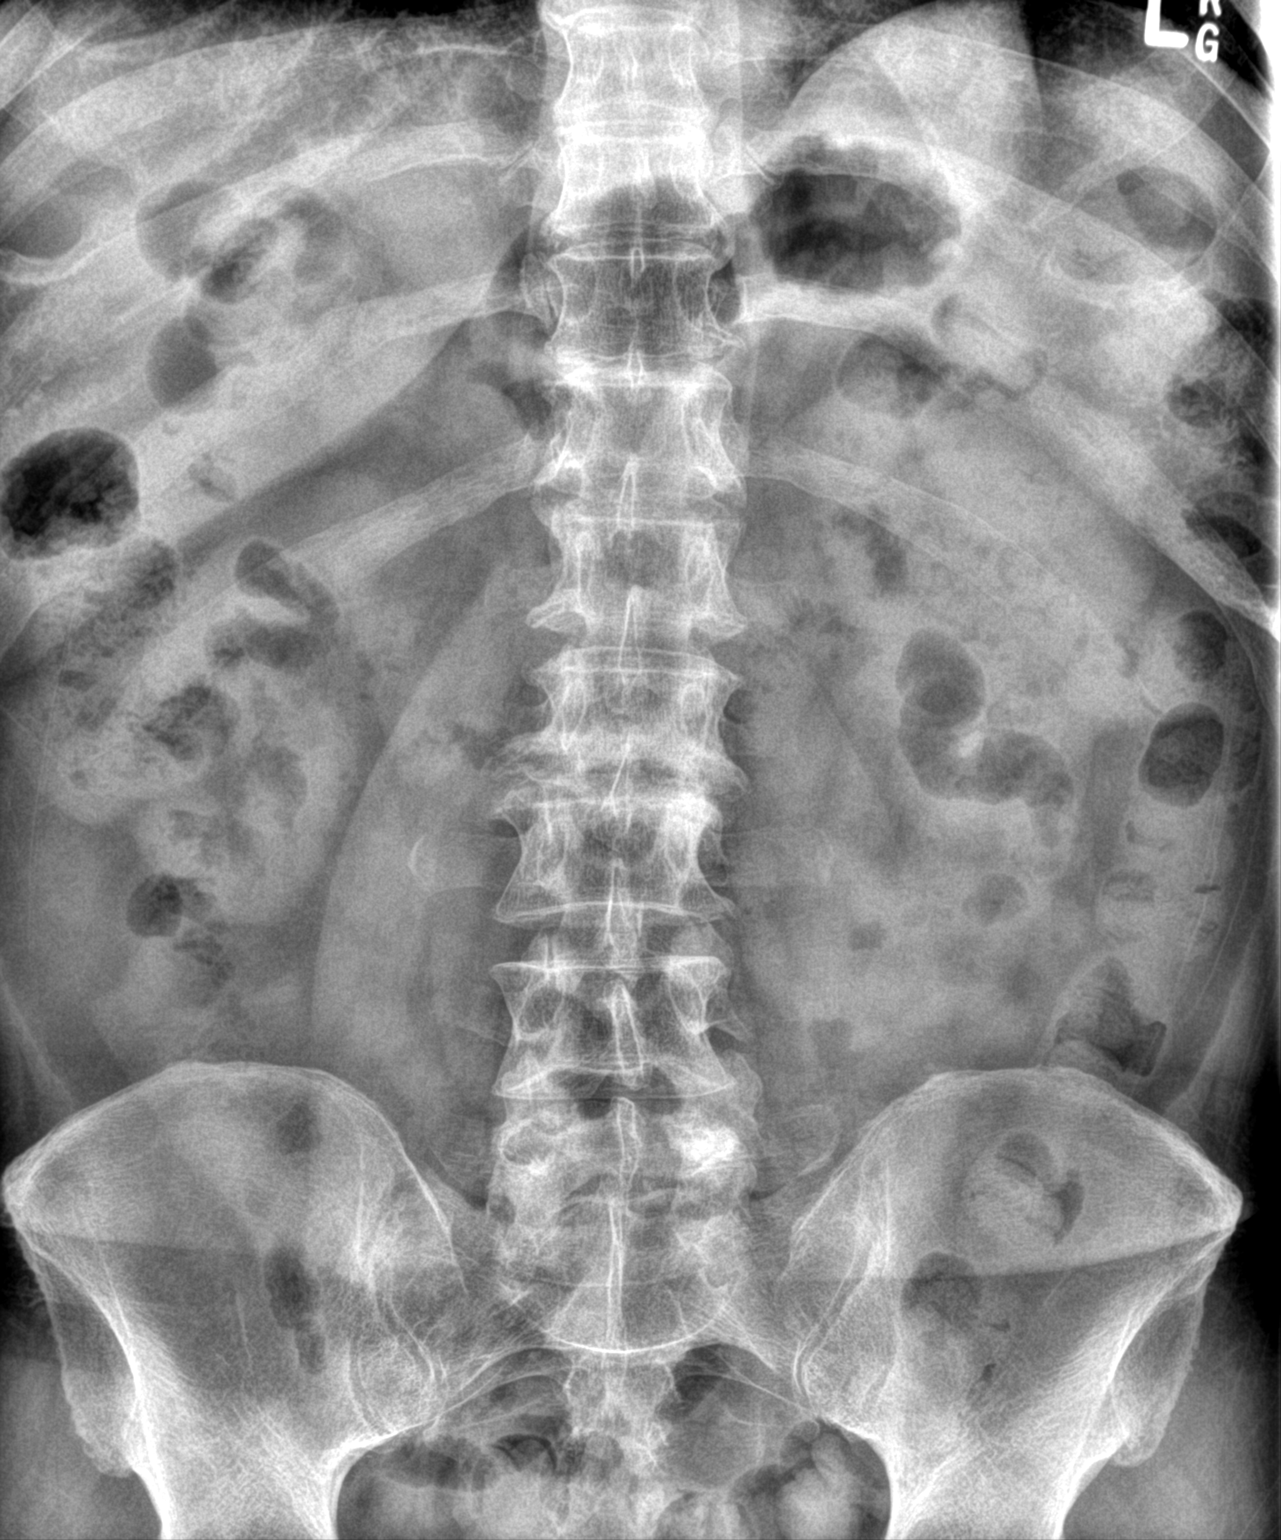
[im 2/3]
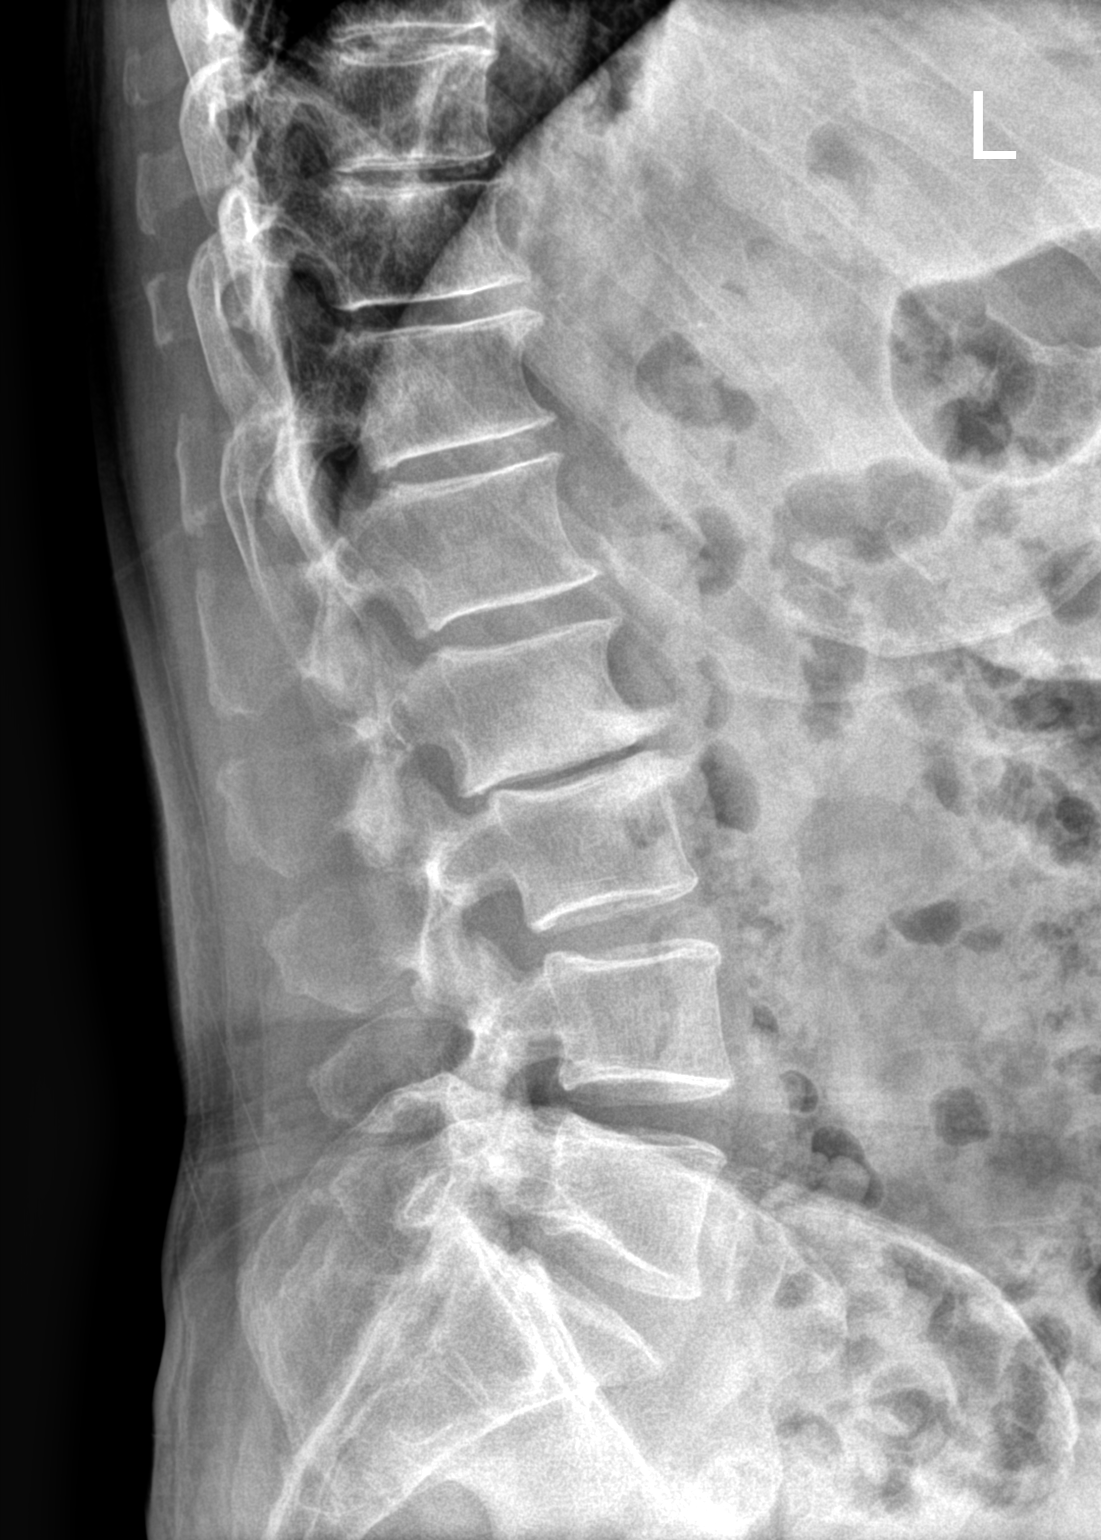
[im 3/3]
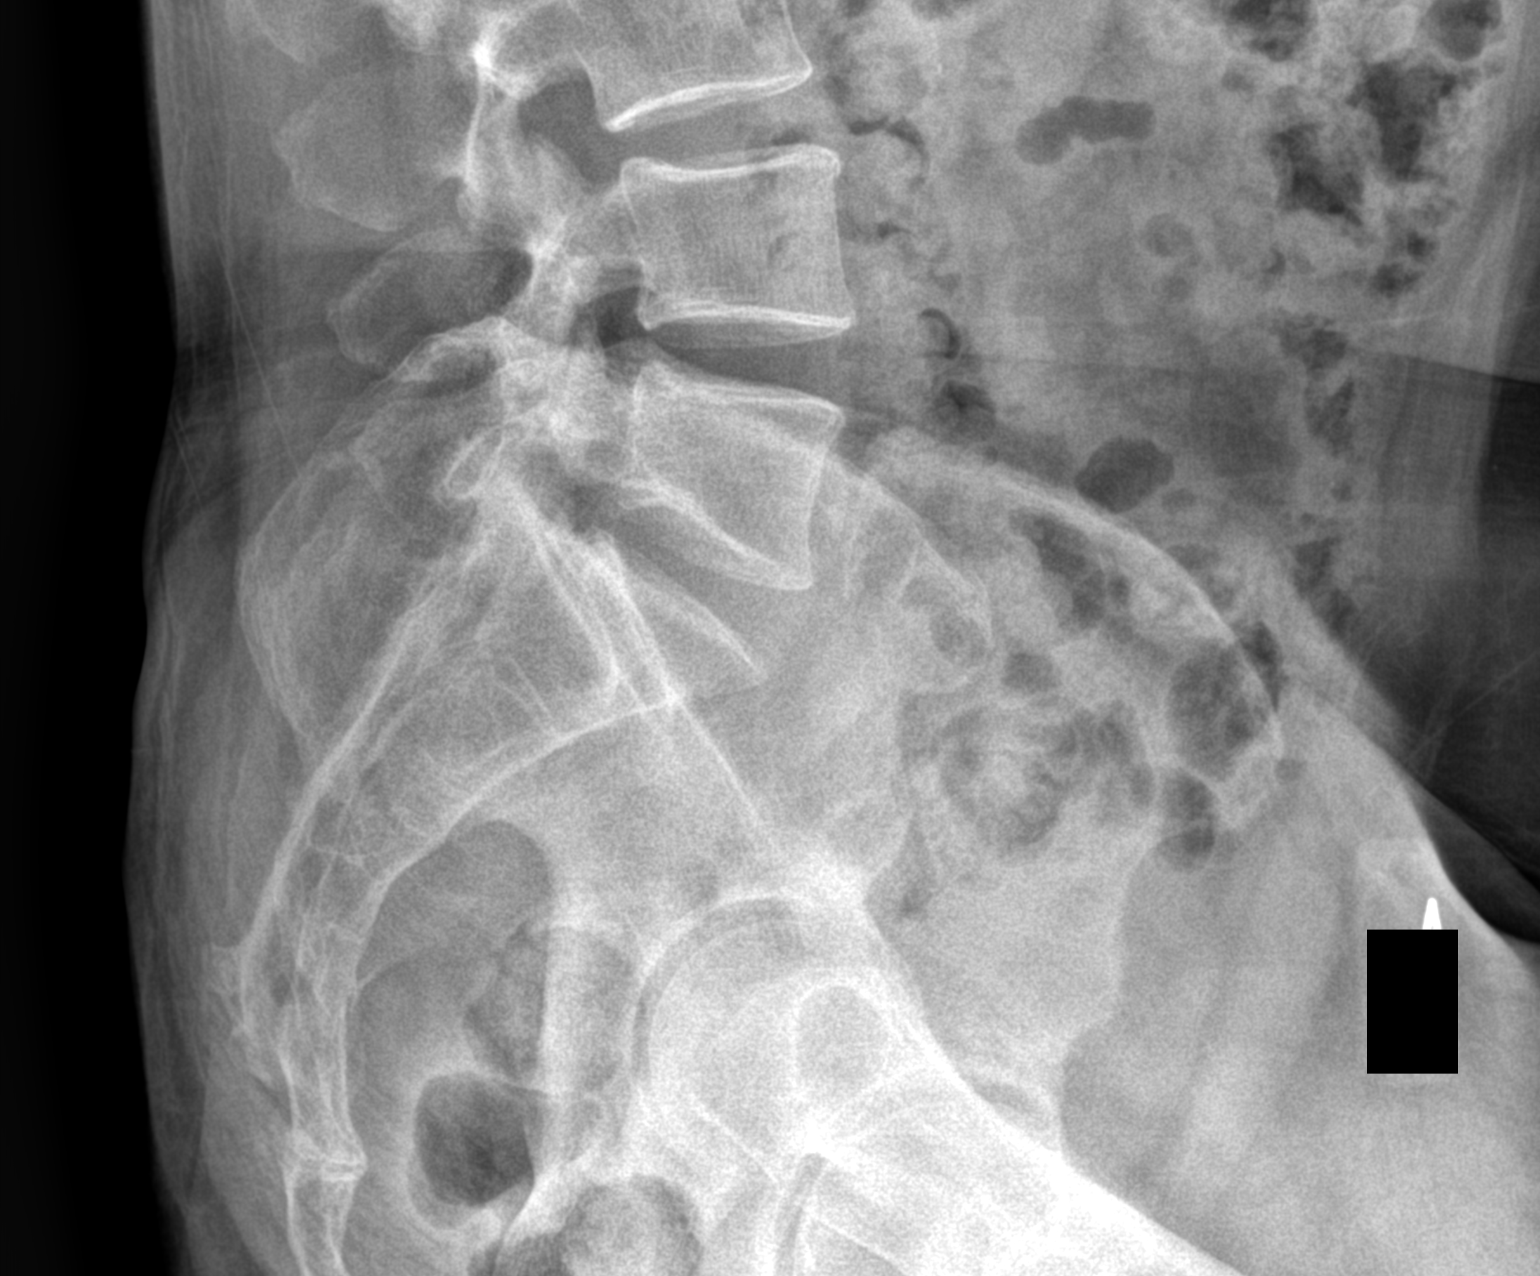

[3 of 3 positions shown; findings below may reference images not displayed]

FINDINGS: No acute bony lesions. Severe degenerative disc changes at L2-3 level consistent with MRI findings with minimal retrolisthesis.  Lesser degenerative changes at other levels.  Soft tissues are unremarkable.
IMPRESSION: Advanced degenerative disc changes with minimal retrolisthesis at L2-3 level.

Electronically Signed by HULLIGER, ANNOUCHKA at 27-Nov-0N09 [DATE]

## 2024-02-02 ENCOUNTER — Other Ambulatory Visit (HOSPITAL_COMMUNITY): Payer: Self-pay | Admitting: Family

## 2024-02-02 DIAGNOSIS — R52 Pain, unspecified: Secondary | ICD-10-CM

## 2024-03-16 ENCOUNTER — Ambulatory Visit (HOSPITAL_COMMUNITY)

## 2024-03-24 ENCOUNTER — Other Ambulatory Visit: Payer: Self-pay

## 2024-03-24 ENCOUNTER — Ambulatory Visit
Admission: RE | Admit: 2024-03-24 | Discharge: 2024-03-24 | Disposition: A | Source: Ambulatory Visit | Attending: Family

## 2024-03-24 DIAGNOSIS — R52 Pain, unspecified: Secondary | ICD-10-CM | POA: Insufficient documentation

## 2024-03-24 DIAGNOSIS — K838 Other specified diseases of biliary tract: Secondary | ICD-10-CM
